# Patient Record
Sex: Female | Born: 1990 | Race: White | Hispanic: No | Marital: Single
Health system: Southern US, Community
[De-identification: ages and names within clinical notes are randomized; demographics above are authoritative.]

## PROBLEM LIST (undated history)

## (undated) DIAGNOSIS — B009 Herpesviral infection, unspecified: Secondary | ICD-10-CM

---

## 2021-02-09 ENCOUNTER — Ambulatory Visit (HOSPITAL_COMMUNITY)
Admission: EM | Admit: 2021-02-09 | Discharge: 2021-02-09 | Disposition: A | Discharge status: Home / Self Care | Payer: BC Managed Care – PPO | Attending: Family Medicine | Admitting: Family Medicine

## 2021-02-09 ENCOUNTER — Other Ambulatory Visit: Payer: Self-pay

## 2021-02-09 ENCOUNTER — Ambulatory Visit (INDEPENDENT_AMBULATORY_CARE_PROVIDER_SITE_OTHER): Payer: BC Managed Care – PPO

## 2021-02-09 ENCOUNTER — Encounter (HOSPITAL_COMMUNITY): Payer: Self-pay | Admitting: Emergency Medicine

## 2021-02-09 DIAGNOSIS — J9801 Acute bronchospasm: Secondary | ICD-10-CM

## 2021-02-09 DIAGNOSIS — R0602 Shortness of breath: Secondary | ICD-10-CM | POA: Diagnosis not present

## 2021-02-09 DIAGNOSIS — J069 Acute upper respiratory infection, unspecified: Secondary | ICD-10-CM | POA: Diagnosis not present

## 2021-02-09 DIAGNOSIS — R059 Cough, unspecified: Secondary | ICD-10-CM

## 2021-02-09 MED ORDER — HYDROCODONE BIT-HOMATROP MBR 5-1.5 MG/5ML PO SOLN: 5.0000 mL | Freq: Four times a day (QID) | ORAL | 0 refills | Status: DC | PRN

## 2021-02-09 MED ORDER — PREDNISONE 20 MG PO TABS: 40.0000 mg | ORAL_TABLET | Freq: Every day | ORAL | 0 refills | Status: DC

## 2021-02-09 NOTE — Discharge Instructions (Signed)
Be aware, your cough medication may cause drowsiness. Please do not drive, operate heavy machinery or make important decisions while on this medication, it can cloud your judgement.  

## 2021-02-09 NOTE — ED Triage Notes (Signed)
Had head cold for 3 weeks. Last week was seen at doctor and was negative for covid and flu and was given amoxicillin. Reports cough is getting worse and SOB with exertion    Neck and abd sore from coughing

## 2021-02-09 NOTE — ED Provider Notes (Signed)
[facility]   [id number] 02/09/21 Arrival Time: 0846  ASSESSMENT & PLAN:  1. Viral URI with cough   2. Bronchospasm, acute    I have personally viewed the imaging studies ordered this visit. No signs of PNA appreciated on CXR.  Likely post-viral cough with bronchospam. Discussed. OTC symptom care as needed.  Meds ordered this encounter  Medications   predniSONE (DELTASONE) 20 MG tablet    Sig: Take 2 tablets (40 mg total) by mouth daily.    Dispense:  10 tablet    Refill:  0   HYDROcodone bit-homatropine (HYCODAN) 5-1.5 MG/5ML syrup    Sig: Take 5 mLs by mouth every 6 (six) hours as needed for cough.    Dispense:  90 mL    Refill:  0     Follow-up Information     Weber, Dema Severin, PA-C.   Specialty: Physician Assistant Why: If worsening or failing to improve as anticipated. Contact information: [address] [place] [address] [phone]                 Reviewed expectations re: course of current medical issues. Questions answered. Outlined signs and symptoms indicating need for more acute intervention. Understanding verbalized. After Visit Summary given.   SUBJECTIVE: History from: patient. Sally Ortiz is a 30 y.o. female who reports: URI symptoms; past 2-3 weeks; finished amox Rx by PCP; neg flu and COVID. Now cough bothering her the most; dry and persistent; affecting sleep; feels she is wheezing at night. Denies: fever. Normal PO intake without n/v/d.  Social History   Tobacco Use  Smoking Status Not on file  Smokeless Tobacco Not on file     OBJECTIVE:  Vitals:   02/09/21 1040  BP: 115/62  Pulse: 60  Resp: 16  Temp: 98.8 F (37.1 C)  TempSrc: Oral  SpO2: 100%    General appearance: alert; no distress Eyes: PERRLA; EOMI; conjunctiva normal HENT: [place]; AT; with mild nasal congestion Neck: supple  Lungs: speaks full sentences without difficulty; unlabored; mild bilateral exp wheezes Extremities: no  edema Skin: warm and dry Neurologic: normal gait Psychological: alert and cooperative; normal mood and affect   Imaging: DG Chest 2 View  Result Date: 02/09/2021 CLINICAL DATA:  Cough and shortness of breath. Head cold for 3 weeks. EXAM: CHEST - 2 VIEW COMPARISON:  None. FINDINGS: Prominent bronchial markings particularly in the left lower lobe. Lungs otherwise clear. No pleural effusion or pneumothorax. Normal heart, mediastinum and hila. Skeletal structures are unremarkable. IMPRESSION: 1. Prominent bronchial markings particularly in the left lower lobe. Suspect active bronchitis given the patient's history. No evidence of pneumonia. Electronically Signed   By: Amie Portland M.D.   On: 02/09/2021 11:00    Not on File  History reviewed. No pertinent past medical history. Social History   Socioeconomic History   Marital status: Single    Spouse name: Not on file   Number of children: Not on file   Years of education: Not on file   Highest education level: Not on file  Occupational History   Not on file  Tobacco Use   Smoking status: Not on file   Smokeless tobacco: Not on file  Substance and Sexual Activity   Alcohol use: Not on file   Drug use: Not on file   Sexual activity: Not on file  Other Topics Concern   Not on file  Social History Narrative   Not on file   Social Determinants of  Health   Financial Resource Strain: Not on file  Food Insecurity: Not on file  Transportation Needs: Not on file  Physical Activity: Not on file  Stress: Not on file  Social Connections: Not on file  Intimate Partner Violence: Not on file   No family history on file. History reviewed. No pertinent surgical history.   Mardella Layman, MD 02/09/21 1124

## 2022-11-06 LAB — OB RESULTS CONSOLE RUBELLA ANTIBODY, IGM: Rubella: NON-IMMUNE/NOT IMMUNE

## 2022-11-06 LAB — OB RESULTS CONSOLE GC/CHLAMYDIA
Chlamydia: NEGATIVE
Neisseria Gonorrhea: NEGATIVE

## 2022-11-06 LAB — OB RESULTS CONSOLE HEPATITIS B SURFACE ANTIGEN: Hepatitis B Surface Ag: NEGATIVE

## 2022-11-06 LAB — HEPATITIS C ANTIBODY: HCV Ab: NEGATIVE

## 2022-11-06 LAB — OB RESULTS CONSOLE VARICELLA ZOSTER ANTIBODY, IGG: Varicella: NON-IMMUNE/NOT IMMUNE

## 2022-11-06 LAB — OB RESULTS CONSOLE HIV ANTIBODY (ROUTINE TESTING): HIV: NONREACTIVE

## 2022-12-12 IMAGING — DX DG CHEST 2V
2 series · 2 of 2 positions shown · non-contrast
Comparison: None.

CLINICAL DATA: Cough and shortness of breath. Head cold for 3
weeks.

EXAM:
CHEST - 2 VIEW

[chest pa]
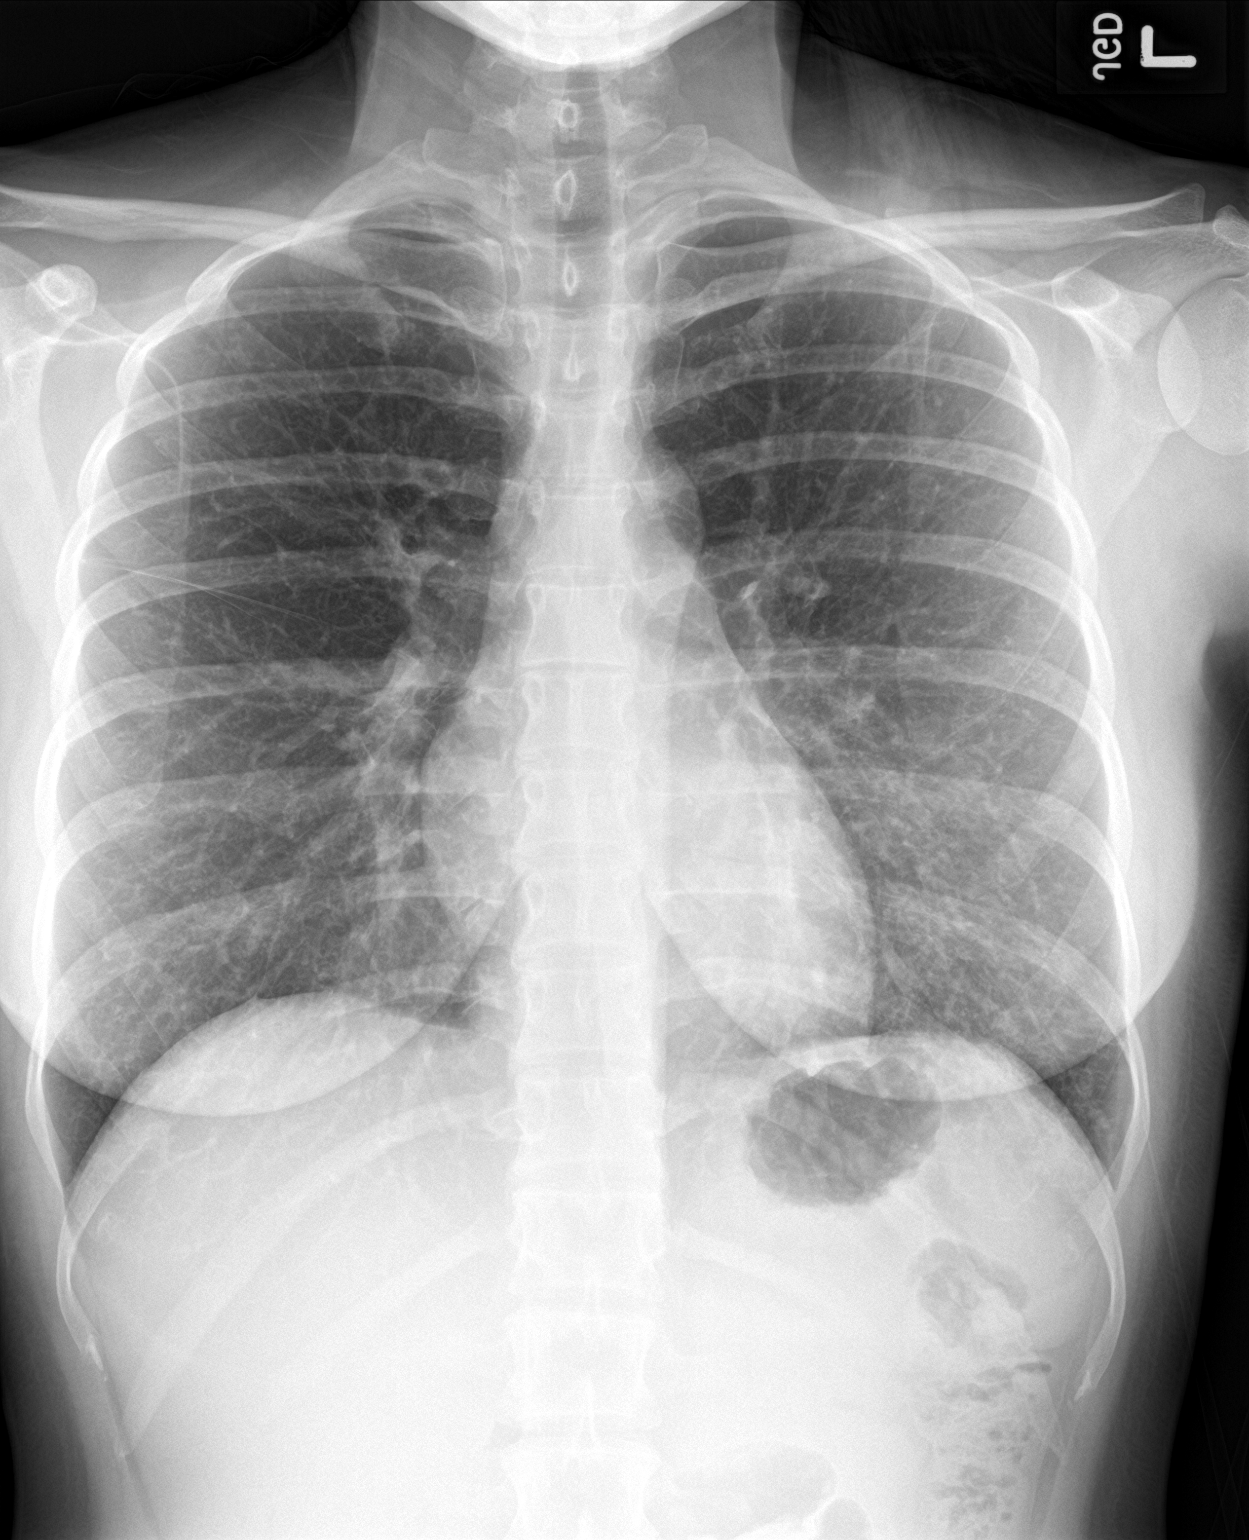

[chest lat]
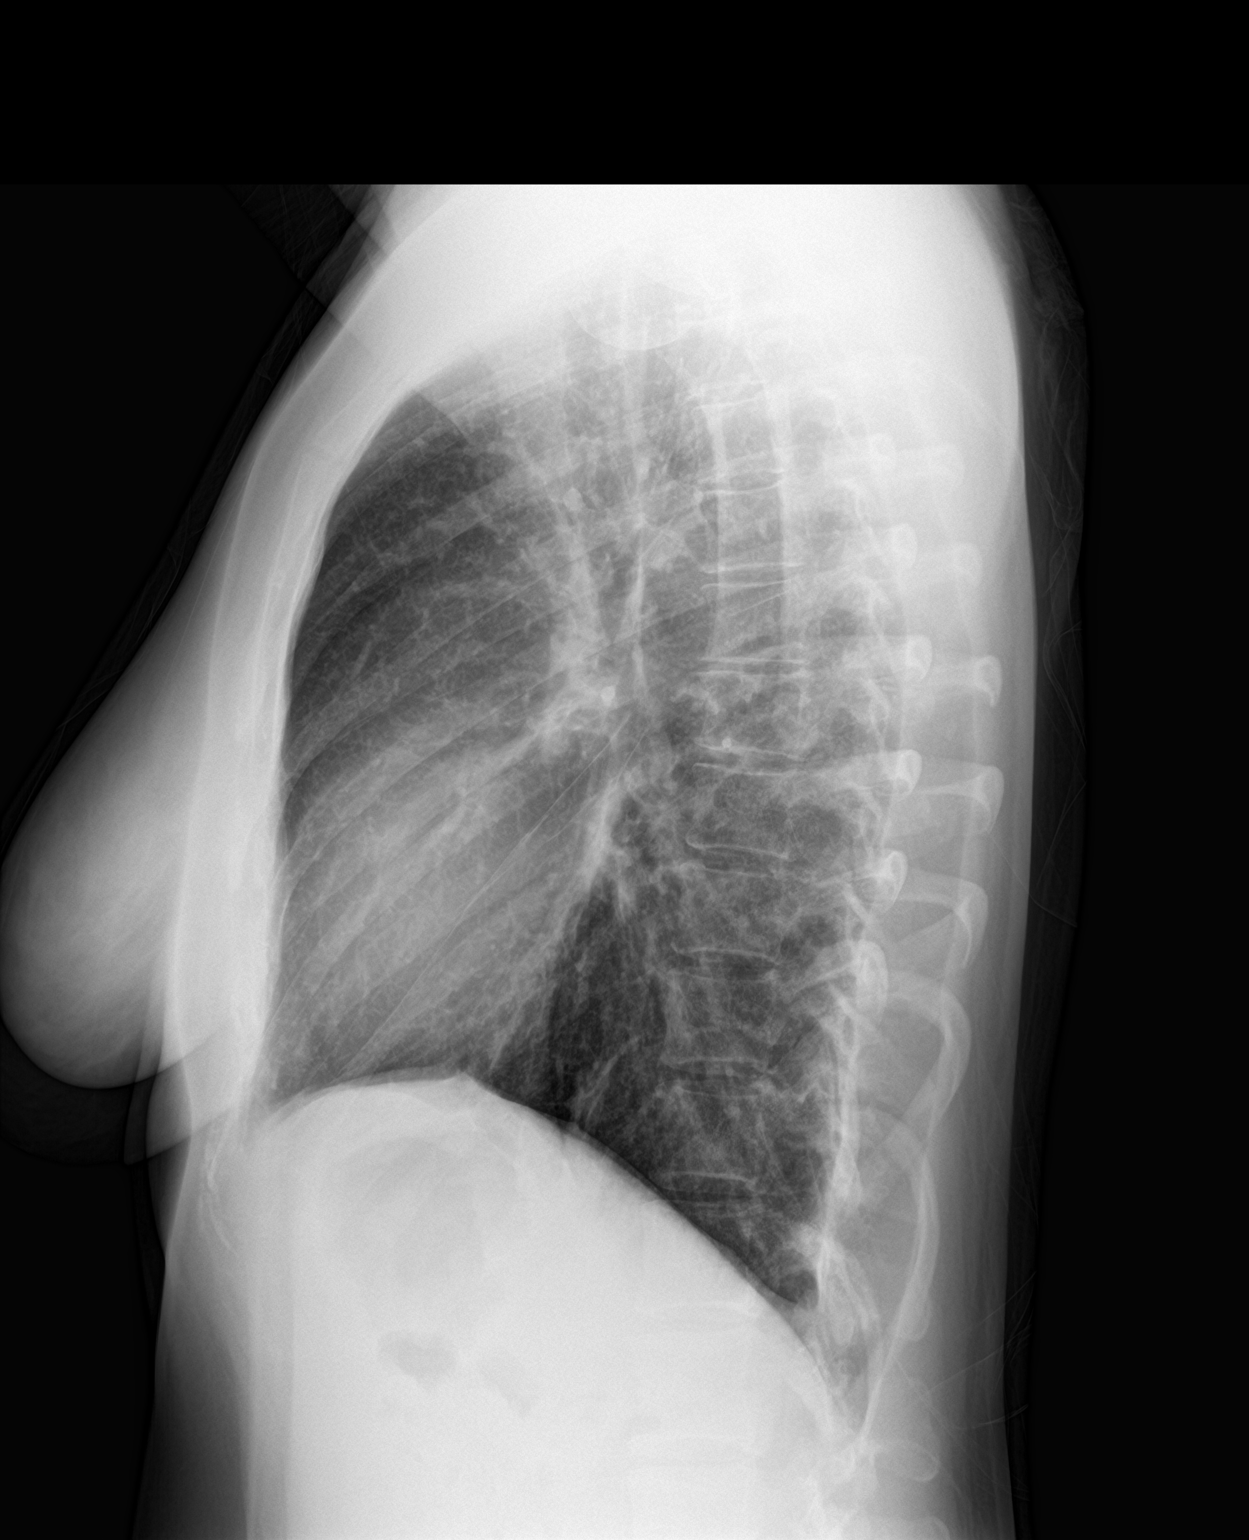

[2 of 2 positions shown; findings below may reference images not displayed]

FINDINGS: Prominent bronchial markings particularly in the left lower lobe.
Lungs otherwise clear.

No pleural effusion or pneumothorax.

Normal heart, mediastinum and hila.

Skeletal structures are unremarkable.
IMPRESSION: 1. Prominent bronchial markings particularly in the left lower lobe.
Suspect active bronchitis given the patient's history. No evidence
of pneumonia.

## 2023-04-11 NOTE — L&D Delivery Note (Signed)
   Delivery Note:   G1P0 at [redacted]w[redacted]d  Admitting diagnosis: Normal labor [O80, Z37.9] Risks: HSV, on Valtrex; gestational thrombocytopenia Onset of labor: 05/26/2023 at 1600 IOL/Augmentation: N/A ROM: 05/26/2023 at 1600, clear fluid  Complete dilation at 05/26/2023 2257 Onset of pushing at 2257 FHR second stage Cat I  Analgesia/Anesthesia intrapartum:Epidural Pushing in lithotomy position with CNM and L&D staff support. Husband, Judie Grieve, present for birth and supportive.  Delivery of a Live born female  Birth Weight:  pending APGAR: 9, 9  Newborn Delivery   Birth date/time: 05/26/2023 23:08:10 Delivery type: Vaginal, Spontaneous    in cephalic presentation, position OA to LOA.  APGAR:1 min-9 , 5 min-9   Nuchal Cord: No  Cord double clamped after cessation of pulsation, cut by Judie Grieve.  Collection of cord blood for typing completed. Arterial cord blood sample-No   Placenta delivered-Spontaneous with 3 vessels. Uterotonics: None Placenta to L&D Uterine tone firm  Bleeding scant  Vaginal laceration identified.  Episiotomy:None Local analgesia: N/A  Repair: 3-0 in usual fashion Est. Blood Loss (mL):110.00  Complications: None  Mom to postpartum. Baby Walker to Couplet care / Skin to Skin.  Delivery Report:  Review the Delivery Report for details.    June Leap, CNM, MSN 05/26/2023, 11:26 PM

## 2023-05-08 ENCOUNTER — Inpatient Hospital Stay (HOSPITAL_COMMUNITY)
Admission: AD | Admit: 2023-05-08 | Discharge: 2023-05-08 | Disposition: A | Discharge status: Home / Self Care | Payer: BC Managed Care – PPO | Attending: Obstetrics and Gynecology | Admitting: Obstetrics and Gynecology

## 2023-05-08 ENCOUNTER — Encounter (HOSPITAL_COMMUNITY): Payer: Self-pay | Admitting: Obstetrics and Gynecology

## 2023-05-08 ENCOUNTER — Encounter: Payer: Self-pay | Admitting: Obstetrics and Gynecology

## 2023-05-08 DIAGNOSIS — J029 Acute pharyngitis, unspecified: Secondary | ICD-10-CM

## 2023-05-08 DIAGNOSIS — O99513 Diseases of the respiratory system complicating pregnancy, third trimester: Secondary | ICD-10-CM | POA: Diagnosis present

## 2023-05-08 DIAGNOSIS — O26893 Other specified pregnancy related conditions, third trimester: Secondary | ICD-10-CM | POA: Diagnosis not present

## 2023-05-08 DIAGNOSIS — Z3A36 36 weeks gestation of pregnancy: Secondary | ICD-10-CM | POA: Diagnosis not present

## 2023-05-08 DIAGNOSIS — J101 Influenza due to other identified influenza virus with other respiratory manifestations: Secondary | ICD-10-CM | POA: Diagnosis not present

## 2023-05-08 DIAGNOSIS — R509 Fever, unspecified: Secondary | ICD-10-CM

## 2023-05-08 DIAGNOSIS — R0981 Nasal congestion: Secondary | ICD-10-CM

## 2023-05-08 HISTORY — DX: Herpesviral infection, unspecified: B00.9

## 2023-05-08 LAB — RESPIRATORY PANEL BY PCR

## 2023-05-08 MED ORDER — FLUTICASONE PROPIONATE 50 MCG/ACT NA SUSP
2.0000 | Freq: Once | NASAL | Status: AC
  Administered 2023-05-08: 2 via NASAL
  Filled 2023-05-08: qty 16

## 2023-05-08 MED ORDER — OSELTAMIVIR PHOSPHATE 75 MG PO CAPS: 75.0000 mg | ORAL_CAPSULE | Freq: Two times a day (BID) | ORAL | 0 refills | Status: DC

## 2023-05-08 MED ORDER — GUAIFENESIN ER 600 MG PO TB12
600.0000 mg | ORAL_TABLET | Freq: Once | ORAL | Status: AC
  Administered 2023-05-08: 600 mg via ORAL
  Filled 2023-05-08: qty 1

## 2023-05-08 MED ORDER — GUAIFENESIN ER 600 MG PO TB12: 600.0000 mg | ORAL_TABLET | Freq: Two times a day (BID) | ORAL | 0 refills | Status: DC | PRN

## 2023-05-08 MED ORDER — PHENOL 1.4 % MT LIQD
1.0000 | OROMUCOSAL | Status: DC | PRN
  Administered 2023-05-08: 1 via OROMUCOSAL
  Filled 2023-05-08: qty 177

## 2023-05-08 MED ORDER — ACETAMINOPHEN 500 MG PO TABS
1000.0000 mg | ORAL_TABLET | Freq: Once | ORAL | Status: AC
  Administered 2023-05-08: 1000 mg via ORAL
  Filled 2023-05-08: qty 2

## 2023-05-08 NOTE — MAU Note (Signed)
..  Sally Ortiz is a 33 y.o. at [redacted]w[redacted]d here in MAU reporting: flu-like symptoms for 2 days. Fever of 102 around 0400. Took tylenol at 2000 last night.  Has had body aches, chills, congestion, cough. Denies vaginal bleeding, leaking of fluid or contractions. +FM  Pain score: 7/10 Vitals:   05/08/23 0537  BP: 113/67  Pulse: (!) 118  Resp: 18  Temp: (!) 100.5 F (38.1 C)  SpO2: 98%     FHT:152 Lab orders placed from triage:  none

## 2023-05-08 NOTE — MAU Provider Note (Signed)
Chief Complaint:  No chief complaint on file.   Event Date/Time   First Provider Initiated Contact with Patient 05/08/23 [phone]     HPI: Sally Ortiz is a 33 y.o. G1P0 at 67w4dwho presents to maternity admissions reporting flu-like symptoms for 2 days with fever 102 last night.  Husband is just recovering from same.. She reports good fetal movement, denies LOF, vaginal bleeding, n/v, diarrhea, constipation or fever/chills.  No history of asthma.    Fever  This is a new problem. The current episode started in the past 7 days. The problem has been unchanged. The maximum temperature noted was 102 to 102.9 F. Associated symptoms include congestion, coughing, muscle aches and a sore throat. Pertinent negatives include no abdominal pain or ear pain. She has tried acetaminophen for the symptoms. The treatment provided mild relief.   RN Note: Norrine Algeo is a 33 y.o. at [redacted]w[redacted]d here in MAU reporting: flu-like symptoms for 2 days. Fever of 102 around 0400. Took tylenol at 2000 last night.  Has had body aches, chills, congestion, cough. Denies vaginal bleeding, leaking of fluid or contractions. +FM  Pain score: 7/10  Past Medical History: No past medical history on file.  Past obstetric history: OB History  Gravida Para Term Preterm AB Living  1       SAB IAB Ectopic Multiple Live Births          # Outcome Date GA Lbr Len/2nd Weight Sex Type Anes PTL Lv  1 Current             Past Surgical History: No past surgical history on file.  Family History: No family history on file.  Social History:    Allergies: Not on File  Meds:  Medications Prior to Admission  Medication Sig Dispense Refill Last Dose/Taking   HYDROcodone bit-homatropine (HYCODAN) 5-1.5 MG/5ML syrup Take 5 mLs by mouth every 6 (six) hours as needed for cough. 90 mL 0    predniSONE (DELTASONE) 20 MG tablet Take 2 tablets (40 mg total) by mouth daily. 10 tablet 0     I have reviewed patient's Past Medical Hx, Surgical  Hx, Family Hx, Social Hx, medications and allergies.   ROS:  Review of Systems  Constitutional:  Positive for fever.  HENT:  Positive for congestion and sore throat. Negative for ear pain.   Respiratory:  Positive for cough.   Gastrointestinal:  Negative for abdominal pain.   Other systems negative  Physical Exam  Patient Vitals for the past 24 hrs:  BP Temp Temp src Pulse Resp SpO2 Height Weight  05/08/23 0537 113/67 (!) 100.5 F (38.1 C) Oral (!) 118 18 98 % -- --  05/08/23 0532 -- -- -- -- -- -- 5\' 3"  (1.6 m) 87.8 kg   Constitutional: Well-developed, well-nourished female in no acute distress.  Cardiovascular: normal rate and rhythm Respiratory: normal effort, clear to auscultation bilaterally   No wheezes. GI: Abd soft, non-tender, gravid appropriate for gestational age.   No rebound or guarding. MS: Extremities nontender, no edema, normal ROM Neurologic: Alert and oriented x 4.  GU: Neg CVAT.   FHT:  Baseline 130 , moderate variability, accelerations present, no decelerations Contractions:  Irregular     Labs: Results for orders placed or performed during the hospital encounter of 05/08/23 (from the past 24 hours)  Respiratory (~20 pathogens) panel by PCR     Status: Abnormal   Collection Time: 05/08/23  5:50 AM   Specimen: Nasopharyngeal Swab; Respiratory  Result Value  Ref Range   Adenovirus NOT DETECTED NOT DETECTED   Coronavirus 229E NOT DETECTED NOT DETECTED   Coronavirus HKU1 NOT DETECTED NOT DETECTED   Coronavirus NL63 NOT DETECTED NOT DETECTED   Coronavirus OC43 NOT DETECTED NOT DETECTED   Metapneumovirus NOT DETECTED NOT DETECTED   Rhinovirus / Enterovirus NOT DETECTED NOT DETECTED   Influenza A H3 DETECTED (A) NOT DETECTED   Influenza B NOT DETECTED NOT DETECTED   Parainfluenza Virus 1 NOT DETECTED NOT DETECTED   Parainfluenza Virus 2 NOT DETECTED NOT DETECTED   Parainfluenza Virus 3 NOT DETECTED NOT DETECTED   Parainfluenza Virus 4 NOT DETECTED NOT  DETECTED   Respiratory Syncytial Virus NOT DETECTED NOT DETECTED   Bordetella pertussis NOT DETECTED NOT DETECTED   Bordetella Parapertussis NOT DETECTED NOT DETECTED   Chlamydophila pneumoniae NOT DETECTED NOT DETECTED   Mycoplasma pneumoniae NOT DETECTED NOT DETECTED       Imaging:  No results found.  MAU Course/MDM: I have reviewed the triage vital signs and the nursing notes.   Pertinent labs & imaging results that were available during my care of the patient were reviewed by me and considered in my medical decision making (see chart for details).      I have reviewed her medical records including past results, notes and treatments.   I have ordered labs and reviewed results. Informed her of Flu diagnosis and discussed Tamiflu. NST reviewed   Treatments in MAU included Tylenol for fever, Flonase for congestion and Chloraseptic for her sore throat. .    Assessment: Single IUP at [redacted]w[redacted]d Influenza A Sore throat Nasal congestion Fever  Plan: Discharge home Rx Tamiflu for Flu A Supportive care Flonase and Mucinex for congestion Labor precautions and fetal kick counts Follow up in Office for prenatal visits and recheck Encouraged to return if she develops worsening of symptoms, increase in pain, fever, or other concerning symptoms.   Pt stable at time of discharge.  Wynelle Bourgeois CNM, MSN Certified Nurse-Midwife 05/08/2023 5:48 AM

## 2023-05-11 LAB — OB RESULTS CONSOLE GBS: GBS: NEGATIVE

## 2023-05-25 ENCOUNTER — Inpatient Hospital Stay (HOSPITAL_COMMUNITY)
Admission: AD | Admit: 2023-05-25 | Payer: BC Managed Care – PPO | Source: Home / Self Care | Admitting: Obstetrics and Gynecology

## 2023-05-25 ENCOUNTER — Inpatient Hospital Stay (HOSPITAL_COMMUNITY): Payer: BC Managed Care – PPO

## 2023-05-26 ENCOUNTER — Encounter (HOSPITAL_COMMUNITY): Payer: Self-pay | Admitting: Obstetrics & Gynecology

## 2023-05-26 ENCOUNTER — Inpatient Hospital Stay (HOSPITAL_COMMUNITY): Payer: BC Managed Care – PPO | Admitting: Anesthesiology

## 2023-05-26 ENCOUNTER — Inpatient Hospital Stay (HOSPITAL_COMMUNITY)
Admission: AD | Admit: 2023-05-26 | Discharge: 2023-05-28 | DRG: 806 | Disposition: A | Discharge status: Home / Self Care | Payer: BC Managed Care – PPO | Attending: Obstetrics & Gynecology | Admitting: Obstetrics & Gynecology

## 2023-05-26 DIAGNOSIS — D696 Thrombocytopenia, unspecified: Secondary | ICD-10-CM | POA: Diagnosis present

## 2023-05-26 DIAGNOSIS — O26893 Other specified pregnancy related conditions, third trimester: Secondary | ICD-10-CM | POA: Diagnosis present

## 2023-05-26 DIAGNOSIS — O9832 Other infections with a predominantly sexual mode of transmission complicating childbirth: Secondary | ICD-10-CM | POA: Diagnosis present

## 2023-05-26 DIAGNOSIS — A6000 Herpesviral infection of urogenital system, unspecified: Secondary | ICD-10-CM | POA: Diagnosis present

## 2023-05-26 DIAGNOSIS — Z3A39 39 weeks gestation of pregnancy: Secondary | ICD-10-CM | POA: Diagnosis not present

## 2023-05-26 DIAGNOSIS — O9912 Other diseases of the blood and blood-forming organs and certain disorders involving the immune mechanism complicating childbirth: Secondary | ICD-10-CM | POA: Diagnosis present

## 2023-05-26 DIAGNOSIS — S3141XA Laceration without foreign body of vagina and vulva, initial encounter: Secondary | ICD-10-CM | POA: Diagnosis not present

## 2023-05-26 LAB — TYPE AND SCREEN
ABO/RH(D): O POS
Antibody Screen: NEGATIVE

## 2023-05-26 LAB — CBC
HCT: 35.7 % — ABNORMAL LOW (ref 36.0–46.0)
Hemoglobin: 12.3 g/dL (ref 12.0–15.0)
MCH: 33.8 pg (ref 26.0–34.0)
MCHC: 34.5 g/dL (ref 30.0–36.0)
MCV: 98.1 fL (ref 80.0–100.0)
Platelets: 118 10*3/uL — ABNORMAL LOW (ref 150–400)
RBC: 3.64 MIL/uL — ABNORMAL LOW (ref 3.87–5.11)
RDW: 13.2 % (ref 11.5–15.5)
WBC: 14.3 10*3/uL — ABNORMAL HIGH (ref 4.0–10.5)
nRBC: 0 % (ref 0.0–0.2)

## 2023-05-26 MED ORDER — PHENYLEPHRINE 80 MCG/ML (10ML) SYRINGE FOR IV PUSH (FOR BLOOD PRESSURE SUPPORT): 80.0000 ug | PREFILLED_SYRINGE | INTRAVENOUS | Status: DC | PRN

## 2023-05-26 MED ORDER — LACTATED RINGERS IV SOLN: INTRAVENOUS | Status: DC

## 2023-05-26 MED ORDER — FLEET ENEMA RE ENEM: 1.0000 | ENEMA | RECTAL | Status: DC | PRN

## 2023-05-26 MED ORDER — ONDANSETRON HCL 4 MG/2ML IJ SOLN: 4.0000 mg | Freq: Four times a day (QID) | INTRAMUSCULAR | Status: DC | PRN

## 2023-05-26 MED ORDER — SOD CITRATE-CITRIC ACID 500-334 MG/5ML PO SOLN: 30.0000 mL | ORAL | Status: DC | PRN

## 2023-05-26 MED ORDER — DIPHENHYDRAMINE HCL 50 MG/ML IJ SOLN: 12.5000 mg | INTRAMUSCULAR | Status: DC | PRN

## 2023-05-26 MED ORDER — OXYCODONE-ACETAMINOPHEN 5-325 MG PO TABS: 2.0000 | ORAL_TABLET | ORAL | Status: DC | PRN

## 2023-05-26 MED ORDER — LACTATED RINGERS IV SOLN: 500.0000 mL | Freq: Once | INTRAVENOUS | Status: DC

## 2023-05-26 MED ORDER — EPHEDRINE 5 MG/ML INJ
10.0000 mg | INTRAVENOUS | Status: DC | PRN
Start: 2023-05-26 — End: 2023-05-26

## 2023-05-26 MED ORDER — LIDOCAINE HCL (PF) 1 % IJ SOLN: 30.0000 mL | INTRAMUSCULAR | Status: DC | PRN

## 2023-05-26 MED ORDER — OXYTOCIN-SODIUM CHLORIDE 30-0.9 UT/500ML-% IV SOLN
2.5000 [IU]/h | INTRAVENOUS | Status: DC
  Filled 2023-05-26: qty 500

## 2023-05-26 MED ORDER — PHENYLEPHRINE 80 MCG/ML (10ML) SYRINGE FOR IV PUSH (FOR BLOOD PRESSURE SUPPORT)
80.0000 ug | PREFILLED_SYRINGE | INTRAVENOUS | Status: DC | PRN
Start: 2023-05-26 — End: 2023-05-26

## 2023-05-26 MED ORDER — OXYTOCIN BOLUS FROM INFUSION: 333.0000 mL | Freq: Once | INTRAVENOUS | Status: DC

## 2023-05-26 MED ORDER — PHENYLEPHRINE 80 MCG/ML (10ML) SYRINGE FOR IV PUSH (FOR BLOOD PRESSURE SUPPORT)
80.0000 ug | PREFILLED_SYRINGE | INTRAVENOUS | Status: DC | PRN
  Filled 2023-05-26: qty 10

## 2023-05-26 MED ORDER — ACETAMINOPHEN 325 MG PO TABS: 650.0000 mg | ORAL_TABLET | ORAL | Status: DC | PRN

## 2023-05-26 MED ORDER — EPHEDRINE 5 MG/ML INJ: 10.0000 mg | INTRAVENOUS | Status: DC | PRN

## 2023-05-26 MED ORDER — LACTATED RINGERS IV SOLN: 500.0000 mL | INTRAVENOUS | Status: DC | PRN

## 2023-05-26 MED ORDER — FENTANYL-BUPIVACAINE-NACL 0.5-0.125-0.9 MG/250ML-% EP SOLN
12.0000 mL/h | EPIDURAL | Status: DC | PRN
  Administered 2023-05-26: 12 mL/h via EPIDURAL
  Filled 2023-05-26: qty 250

## 2023-05-26 MED ORDER — FENTANYL-BUPIVACAINE-NACL 0.5-0.125-0.9 MG/250ML-% EP SOLN: 12.0000 mL/h | EPIDURAL | Status: DC | PRN

## 2023-05-26 MED ORDER — EPHEDRINE 5 MG/ML INJ
10.0000 mg | INTRAVENOUS | Status: DC | PRN
  Filled 2023-05-26: qty 5

## 2023-05-26 MED ORDER — OXYCODONE-ACETAMINOPHEN 5-325 MG PO TABS: 1.0000 | ORAL_TABLET | ORAL | Status: DC | PRN

## 2023-05-26 MED ORDER — LIDOCAINE-EPINEPHRINE (PF) 2 %-1:200000 IJ SOLN
INTRAMUSCULAR | Status: DC | PRN
  Administered 2023-05-26: 3 mL via EPIDURAL

## 2023-05-26 NOTE — H&P (Signed)
 OB ADMISSION/ HISTORY & PHYSICAL:  Admission Date: 05/26/2023  5:41 PM  Admit Diagnosis: Normal labor [O80, Z37.9]    Sally Ortiz is a 33 y.o. female G1P0 at [redacted]w[redacted]d presenting for labor. States contractions all day and a gush of clear fluid at 1600. Now reports contractions every 3 minutes. Denies vaginal bleeding. Endorses + fetal movement. Husband, Judie Grieve, present and supportive. Eagerly anticipating a baby boy "Walker".   Prenatal History: G1P0   EDC: 06/01/2023 Prenatal care at [facility] since 10 weeks  Primary: A. Yetta Barre, CNM  Prenatal course complicated by: History of HSV, on Valtrex 1g daily since [redacted] weeks Gestational thrombocytopenia, resolved at 38 weeks, platelets in the office 186 Fetal renal pyelectasis, resolved at 37 week U/S  Prenatal Labs: ABO, Rh:   O POS Antibody: PENDING (02/15 1755) Rubella:   Immune RPR:   Non-reactive HBsAg:   Negative HIV:   Negative GBS:   Negative 1 hr Glucola : 123 Genetic Screening: Low risk Panorama XY Ultrasound: normal XY anatomy, posterior placenta, EFW 71% at 37 weeks    Maternal Diabetes: No Genetic Screening: Normal Maternal Ultrasounds/Referrals: Normal Fetal Ultrasounds or other Referrals:  None Maternal Substance Abuse:  No Significant Maternal Medications:  None Significant Maternal Lab Results:  Group B Strep negative Other Comments:  None  Medical / Surgical History : Past medical history:  Past Medical History:  Diagnosis Date   HSV (herpes simplex virus) infection     Past surgical history: No past surgical history on file.  Family History: No family history on file.  Social History:  reports that she has never smoked. She has never used smokeless tobacco. She reports that she does not drink alcohol and does not use drugs.  Allergies: Erythromycin   Current Medications at time of admission:  Medications Prior to Admission  Medication Sig Dispense Refill Last Dose/Taking   ferrous sulfate 325 (65  FE) MG EC tablet Take 325 mg by mouth 3 (three) times daily with meals.   05/25/2023 Evening   Prenatal Vit-Fe Fumarate-FA (MULTIVITAMIN-PRENATAL) 27-0.8 MG TABS tablet Take 1 tablet by mouth daily at 12 noon.   05/25/2023 Evening   valACYclovir (VALTREX) 500 MG tablet Take 500 mg by mouth 2 (two) times daily.   05/25/2023 Evening   guaiFENesin (MUCINEX) 600 MG 12 hr tablet Take 1 tablet (600 mg total) by mouth 2 (two) times daily as needed for to loosen phlegm. 60 tablet 0    HYDROcodone bit-homatropine (HYCODAN) 5-1.5 MG/5ML syrup Take 5 mLs by mouth every 6 (six) hours as needed for cough. 90 mL 0    oseltamivir (TAMIFLU) 75 MG capsule Take 1 capsule (75 mg total) by mouth every 12 (twelve) hours. 10 capsule 0    predniSONE (DELTASONE) 20 MG tablet Take 2 tablets (40 mg total) by mouth daily. 10 tablet 0     Review of Systems: Review of Systems  All other systems reviewed and are negative.  Physical Exam: Vital signs and nursing notes reviewed.  Patient Vitals for the past 24 hrs:  BP Temp Temp src Pulse Resp SpO2  05/26/23 1752 120/78 98.7 F (37.1 C) Oral 85 16 --  05/26/23 1750 (!) 83/68 -- -- 79 -- 96 %    General: AAO x 3, NAD Heart: RRR Lungs:CTAB Abdomen: Gravid, NT Extremities: no edema SVE: Dilation: 3 Effacement (%): 90 Station: -1 Presentation: Vertex Exam by:: Boone Master, RN   FHR: 135BPM, moderate variability, + accels, no decels TOCO: Contractions every 2-3  minutes  Labs:   Recent Labs    05/26/23 1755  WBC 14.3*  HGB 12.3  HCT 35.7*  PLT 118*   Assessment/Plan: 33 y.o. G1P0 at [redacted]w[redacted]d, early labor with SROM History of HSV, on Valtrex, no lesions on exam Gestational thrombocytopenia, last platelets in the office 186, on admission, 118  Fetal wellbeing - FHT category 1 EFW AGA 7-8lbs  Labor: Plan expectant management, Pitocin prn  GBS negative Rubella immune Rh positive  Pain control: originally desired unmedicated waterbirth, now desires  epidural Analgesia/anesthesia PRN  Anticipated MOD: NSVB  Plans to breastfeed, plans circumcision. POC discussed with patient and support team, all questions answered.  Dr. Juliene Pina notified of admission/plan of care.  June Leap CNM, MSN 05/26/2023, 6:49 PM

## 2023-05-26 NOTE — Anesthesia Preprocedure Evaluation (Signed)
 Anesthesia Evaluation  Patient identified by MRN, date of birth, ID band Patient awake    Airway Mallampati: I       Dental no notable dental hx.    Pulmonary    Pulmonary exam normal        Cardiovascular Normal cardiovascular exam     Neuro/Psych    GI/Hepatic   Endo/Other    Renal/GU      Musculoskeletal   Abdominal   Peds  Hematology   Anesthesia Other Findings   Reproductive/Obstetrics (+) Pregnancy                             Anesthesia Physical Anesthesia Plan  ASA: 2  Anesthesia Plan: Epidural   Post-op Pain Management:    Induction:   PONV Risk Score and Plan: 0  Airway Management Planned: Natural Airway  Additional Equipment: None  Intra-op Plan:   Post-operative Plan:   Informed Consent: I have reviewed the patients History and Physical, chart, labs and discussed the procedure including the risks, benefits and alternatives for the proposed anesthesia with the patient or authorized representative who has indicated his/her understanding and acceptance.       Plan Discussed with:   Anesthesia Plan Comments: (Lab Results      Component                Value               Date                      WBC                      14.3 (H)            05/26/2023                HGB                      12.3                05/26/2023                HCT                      35.7 (L)            05/26/2023                MCV                      98.1                05/26/2023                PLT                      118 (L)             05/26/2023           )       Anesthesia Quick Evaluation

## 2023-05-26 NOTE — Anesthesia Procedure Notes (Signed)
 Epidural Patient location during procedure: OB Start time: 05/26/2023 6:58 PM End time: 05/26/2023 7:07 PM  Staffing Anesthesiologist: Shelton Silvas, MD Performed: anesthesiologist   Preanesthetic Checklist Completed: patient identified, IV checked, site marked, risks and benefits discussed, surgical consent, monitors and equipment checked, pre-op evaluation and timeout performed  Epidural Patient position: sitting Prep: DuraPrep Patient monitoring: heart rate, continuous pulse ox and blood pressure Approach: midline Location: L3-L4 Injection technique: LOR saline  Needle:  Needle type: Tuohy  Needle gauge: 17 G Needle length: 9 cm Catheter type: closed end flexible Catheter size: 20 Guage Test dose: negative and 1.5% lidocaine  Assessment Events: blood not aspirated, no cerebrospinal fluid, injection not painful, no injection resistance and no paresthesia  Additional Notes LOR @ 5.5 on 1st attempt, unable to advance catheter  LOR @ 5.5 on 2nd attempt, catheter advanced with some resistance.   Patient identified. Risks/Benefits/Options discussed with patient including but not limited to bleeding, infection, nerve damage, paralysis, failed block, incomplete pain control, headache, blood pressure changes, nausea, vomiting, reactions to medications, itching and postpartum back pain. Confirmed with bedside nurse the patient's most recent platelet count. Confirmed with patient that they are not currently taking any anticoagulation, have any bleeding history or any family history of bleeding disorders. Patient expressed understanding and wished to proceed. All questions were answered. Sterile technique was used throughout the entire procedure. Please see nursing notes for vital signs. Test dose was given through epidural catheter and negative prior to continuing to dose epidural or start infusion. Warning signs of high block given to the patient including shortness of breath,  tingling/numbness in hands, complete motor block, or any concerning symptoms with instructions to call for help. Patient was given instructions on fall risk and not to get out of bed. All questions and concerns addressed with instructions to call with any issues or inadequate analgesia.    Reason for block:procedure for pain

## 2023-05-27 ENCOUNTER — Encounter (HOSPITAL_COMMUNITY): Payer: Self-pay | Admitting: Obstetrics & Gynecology

## 2023-05-27 LAB — CBC WITH DIFFERENTIAL/PLATELET
Abs Immature Granulocytes: 0.1 10*3/uL — ABNORMAL HIGH (ref 0.00–0.07)
Basophils Absolute: 0 10*3/uL (ref 0.0–0.1)
Basophils Relative: 0 %
Eosinophils Absolute: 0 10*3/uL (ref 0.0–0.5)
Eosinophils Relative: 0 %
HCT: 30.9 % — ABNORMAL LOW (ref 36.0–46.0)
Hemoglobin: 10.7 g/dL — ABNORMAL LOW (ref 12.0–15.0)
Immature Granulocytes: 1 %
Lymphocytes Relative: 5 %
Lymphs Abs: 0.8 10*3/uL (ref 0.7–4.0)
MCH: 33.9 pg (ref 26.0–34.0)
MCHC: 34.6 g/dL (ref 30.0–36.0)
MCV: 97.8 fL (ref 80.0–100.0)
Monocytes Absolute: 1.1 10*3/uL — ABNORMAL HIGH (ref 0.1–1.0)
Monocytes Relative: 6 %
Neutro Abs: 15 10*3/uL — ABNORMAL HIGH (ref 1.7–7.7)
Neutrophils Relative %: 88 %
Platelets: 122 10*3/uL — ABNORMAL LOW (ref 150–400)
RBC: 3.16 MIL/uL — ABNORMAL LOW (ref 3.87–5.11)
RDW: 13.1 % (ref 11.5–15.5)
WBC: 17 10*3/uL — ABNORMAL HIGH (ref 4.0–10.5)
nRBC: 0 % (ref 0.0–0.2)

## 2023-05-27 LAB — CBC
HCT: 31.3 % — ABNORMAL LOW (ref 36.0–46.0)
Hemoglobin: 11.1 g/dL — ABNORMAL LOW (ref 12.0–15.0)
MCH: 34.5 pg — ABNORMAL HIGH (ref 26.0–34.0)
MCHC: 35.5 g/dL (ref 30.0–36.0)
MCV: 97.2 fL (ref 80.0–100.0)
Platelets: 135 10*3/uL — ABNORMAL LOW (ref 150–400)
RBC: 3.22 MIL/uL — ABNORMAL LOW (ref 3.87–5.11)
RDW: 13.1 % (ref 11.5–15.5)
WBC: 17.8 10*3/uL — ABNORMAL HIGH (ref 4.0–10.5)
nRBC: 0 % (ref 0.0–0.2)

## 2023-05-27 LAB — RPR: RPR Ser Ql: NONREACTIVE

## 2023-05-27 MED ORDER — ACETAMINOPHEN 325 MG PO TABS: 650.0000 mg | ORAL_TABLET | ORAL | Status: DC | PRN

## 2023-05-27 MED ORDER — SIMETHICONE 80 MG PO CHEW: 80.0000 mg | CHEWABLE_TABLET | ORAL | Status: DC | PRN

## 2023-05-27 MED ORDER — BENZOCAINE-MENTHOL 20-0.5 % EX AERO
1.0000 | INHALATION_SPRAY | CUTANEOUS | Status: DC | PRN
  Administered 2023-05-27: 1 via TOPICAL
  Filled 2023-05-27: qty 56

## 2023-05-27 MED ORDER — WITCH HAZEL-GLYCERIN EX PADS: 1.0000 | MEDICATED_PAD | CUTANEOUS | Status: DC | PRN

## 2023-05-27 MED ORDER — SENNOSIDES-DOCUSATE SODIUM 8.6-50 MG PO TABS
2.0000 | ORAL_TABLET | Freq: Every day | ORAL | Status: DC
  Administered 2023-05-27: 2 via ORAL
  Filled 2023-05-27: qty 2

## 2023-05-27 MED ORDER — COCONUT OIL OIL: 1.0000 | TOPICAL_OIL | Status: DC | PRN

## 2023-05-27 MED ORDER — DIPHENHYDRAMINE HCL 25 MG PO CAPS: 25.0000 mg | ORAL_CAPSULE | Freq: Four times a day (QID) | ORAL | Status: DC | PRN

## 2023-05-27 MED ORDER — TETANUS-DIPHTH-ACELL PERTUSSIS 5-2.5-18.5 LF-MCG/0.5 IM SUSY: 0.5000 mL | PREFILLED_SYRINGE | Freq: Once | INTRAMUSCULAR | Status: DC

## 2023-05-27 MED ORDER — ONDANSETRON HCL 4 MG/2ML IJ SOLN: 4.0000 mg | INTRAMUSCULAR | Status: DC | PRN

## 2023-05-27 MED ORDER — DIBUCAINE (PERIANAL) 1 % EX OINT: 1.0000 | TOPICAL_OINTMENT | CUTANEOUS | Status: DC | PRN

## 2023-05-27 MED ORDER — IBUPROFEN 600 MG PO TABS
600.0000 mg | ORAL_TABLET | Freq: Four times a day (QID) | ORAL | Status: DC
  Administered 2023-05-27 – 2023-05-28 (×4): 600 mg via ORAL
  Filled 2023-05-27 (×6): qty 1

## 2023-05-27 MED ORDER — PRENATAL MULTIVITAMIN CH
1.0000 | ORAL_TABLET | Freq: Every day | ORAL | Status: DC
  Administered 2023-05-27: 1 via ORAL
  Filled 2023-05-27: qty 1

## 2023-05-27 MED ORDER — ZOLPIDEM TARTRATE 5 MG PO TABS: 5.0000 mg | ORAL_TABLET | Freq: Every evening | ORAL | Status: DC | PRN

## 2023-05-27 MED ORDER — ONDANSETRON HCL 4 MG PO TABS: 4.0000 mg | ORAL_TABLET | ORAL | Status: DC | PRN

## 2023-05-27 NOTE — Lactation Note (Signed)
 This note was copied from a baby's chart. Lactation Consultation Note  Patient Name: Sally Ortiz Today's Date: 05/27/2023 Age:32 hours Reason for consult: L&D Initial assessment;1st time breastfeeding;Term MOB had infant latched on her left breast when LC entered room, infant had been feeding for 15 minutes, MOB switched infant to her right breast using football hold with pillow support infant BF additional 10 minutes. Infant total feeding was 25 minutes. MOB was doing skin to skin with infant when LC left the room. MOB attended BF classes during her pregnancy. MOB knows to BF infant by cues, on demand, every 2-3 hours, skin to skin. MOB knows to ask for further latch assistance on MBU if needed.   Maternal Data    Feeding Mother's Current Feeding Choice: Breast Milk  LATCH Score Latch: Grasps breast easily, tongue down, lips flanged, rhythmical sucking.  Audible Swallowing: A few with stimulation  Type of Nipple: Everted at rest and after stimulation  Comfort (Breast/Nipple): Soft / non-tender  Hold (Positioning): Assistance needed to correctly position infant at breast and maintain latch.  LATCH Score: 8   Lactation Tools Discussed/Used    Interventions Interventions: Assisted with latch;Skin to skin;Breast compression;Adjust position;Support pillows;Position options;Education  Discharge    Consult Status Consult Status: Follow-up from L&D    Frederico Hamman 05/27/2023, 12:18 AM

## 2023-05-27 NOTE — Lactation Note (Signed)
 This note was copied from a baby's chart. Lactation Consultation Note  Patient Name: Sally Ortiz [id number] Date: 05/27/2023 Age:31 hours Reason for consult: Follow-up assessment;1st time breastfeeding;Primapara;Term;Breastfeeding assistance Baby latched for 7 mins with swallows and released.  Breast feeding basics reviewed  see below   Maternal Data Has patient been taught Hand Expression?: Yes  Feeding Mother's Current Feeding Choice: Breast Milk  LATCH Score Latch: Grasps breast easily, tongue down, lips flanged, rhythmical sucking.  Audible Swallowing: Spontaneous and intermittent  Type of Nipple: Everted at rest and after stimulation  Comfort (Breast/Nipple): Soft / non-tender  Hold (Positioning): Assistance needed to correctly position infant at breast and maintain latch.  LATCH Score: 9   Lactation Tools Discussed/Used Tools: Pump;Flanges Flange Size: 18;21 Breast pump type: Manual Pump Education: Setup, frequency, and cleaning;Milk Storage Reason for Pumping: PRN - preparing for D/C  Interventions Interventions: Breast feeding basics reviewed;Assisted with latch;Skin to skin;Breast massage;Breast compression;Adjust position;Support pillows;Position options;Education;LC Services brochure;CDC Guidelines for Breast Pump Cleaning  Discharge Pump: Manual;Personal;DEBP  Consult Status Consult Status: Follow-up Date: 05/28/23 Follow-up type: In-patient    Sally Ortiz 05/27/2023, 12:40 PM

## 2023-05-27 NOTE — Lactation Note (Signed)
 This note was copied from a baby's chart. Lactation Consultation Note  Patient Name: Sally Ortiz Today's Date: 05/27/2023 Age:32 hours. P1  Reason for consult: Initial assessment;Primapara;1st time breastfeeding;Term Per mom baby recently fed 20 mins with swallows at 0805.  LC reviewed and updated the doc flow sheets with mom. HNV, 3 stools. BF x 2 20 -25 mins and attempts. Per mom the baby has been spitty.  Dad holding baby and he is asleep.  LC reviewed 24 hour feeding goals of feed with cues and by 3 hours STS and offer the breast. If baby is still hungry after the 1st breast offer the 2nd breast. ( 8-12 times a day )  LC encouraged mom to call with cues.   Maternal Data - no risk for milk supply     Feeding Mother's Current Feeding Choice: Breast Milk  LATCH Score 8-5    Lactation Tools Discussed/Used Tools: Pump;Flanges Flange Size: 18;21 Breast pump type: Manual Pump Education: Setup, frequency, and cleaning;Milk Storage Reason for Pumping: PRN - preparing for D/C  Interventions Interventions: Breast feeding basics reviewed;Reverse pressure;Hand pump;Education;CDC Guidelines for Breast Pump Cleaning  Discharge Pump: Manual;Personal;DEBP  Consult Status Consult Status: Follow-up Date: 05/27/23 Follow-up type: In-patient    Sally Ortiz 05/27/2023, 9:22 AM

## 2023-05-27 NOTE — Anesthesia Postprocedure Evaluation (Signed)
 Anesthesia Post Note  Patient: Sally Ortiz  Procedure(s) Performed: AN AD HOC LABOR EPIDURAL     Patient location during evaluation: Mother Baby Anesthesia Type: Epidural Level of consciousness: awake and alert Pain management: pain level controlled Vital Signs Assessment: post-procedure vital signs reviewed and stable Respiratory status: spontaneous breathing, nonlabored ventilation and respiratory function stable Cardiovascular status: stable Postop Assessment: no headache, no backache, epidural receding, no apparent nausea or vomiting, patient able to bend at knees, able to ambulate and adequate PO intake Anesthetic complications: no   No notable events documented.  Last Vitals:  Vitals:   05/27/23 1209 05/27/23 1417  BP: (!) 101/55 94/79  Pulse: 70 68  Resp: 18 18  Temp: 36.9 C 36.6 C  SpO2: 99% 100%    Last Pain:  Vitals:   05/27/23 1421  TempSrc:   PainSc: 0-No pain   Pain Goal:                   [employer]

## 2023-05-27 NOTE — Progress Notes (Signed)
 Post Partum Day 1   Subjective: no complaints, up ad lib, voiding, tolerating PO, and + flatus  Objective: Blood pressure (!) 101/55, pulse 70, temperature 98.4 F (36.9 C), temperature source Oral, resp. rate 18, height 5\' 3"  (1.6 m), weight 87.8 kg, SpO2 99%, unknown if currently breastfeeding.  Physical Exam:  General: cooperative and appears older than stated age Lochia: appropriate Uterine Fundus: firm Incision: healing well, no significant drainage DVT Evaluation: No evidence of DVT seen on physical exam. Negative Homan's sign.     Latest Ref Rng & Units 05/27/2023    5:12 AM 05/27/2023    1:12 AM 05/26/2023    5:55 PM  CBC  WBC 4.0 - 10.5 K/uL 17.8  17.0  14.3   Hemoglobin 12.0 - 15.0 g/dL 14.7  82.9  56.2   Hematocrit 36.0 - 46.0 % 31.3  30.9  35.7   Platelets 150 - 400 K/uL 135  122  118       Assessment/Plan: PPD #1 SVD, Boy, breast feeding LC to assist Routine PP care Circumcision tomorrow     LOS: 1 day   Robley Fries, MD 05/27/2023, 12:55 PM

## 2023-05-27 NOTE — Progress Notes (Signed)
 CSW attempted to meet with MOB due to history of depression/anxiety. When CSW entered room, several visitors were present. CSW to follow up at a later time.   Signed,  Norberto Sorenson, MSW, LCSWA, LCASA May 07, 2023 3:21 PM

## 2023-05-28 MED ORDER — IBUPROFEN 200 MG PO TABS
600.0000 mg | ORAL_TABLET | Freq: Four times a day (QID) | ORAL | Status: AC
Start: 2023-05-28 — End: 2023-06-04

## 2023-05-28 MED ORDER — POLYETHYLENE GLYCOL 3350 17 G PO PACK: 17.0000 g | PACK | Freq: Every day | ORAL | Status: AC

## 2023-05-28 MED ORDER — ACETAMINOPHEN 500 MG PO TABS: 1000.0000 mg | ORAL_TABLET | Freq: Four times a day (QID) | ORAL | Status: AC

## 2023-05-28 NOTE — Lactation Note (Signed)
 This note was copied from a baby's chart. Lactation Consultation Note  Patient Name: Sally Ortiz [place] [id number] Date: 05/28/2023 Age:32 hours Reason for consult: Follow-up assessment;Primapara;1st time breastfeeding;Term;Infant weight loss (5 % weight loss,) Per mom Breast feeding is going well since yesterday and last fed at 7:45 am. Per mom baby is for a circ this am.  LC reviewed breast feeding D/C teaching and the [facility] resources.  Per mom has LC resources from her friends.   Maternal Data Has patient been taught Hand Expression?: Yes  Feeding Mother's Current Feeding Choice: Breast Milk  LATCH Score 8-9     Lactation Tools Discussed/Used Tools: Pump Flange Size: 21;18 Breast pump type: Manual Pump Education: Milk Storage;Setup, frequency, and cleaning Reason for Pumping: PRN  Interventions Interventions: Breast feeding basics reviewed;Hand pump;Education;LC Services brochure;CDC Guidelines for Breast Pump Cleaning  Discharge Discharge Education: Engorgement and breast care Pump: Manual;Personal;DEBP WIC Program: No  Consult Status Consult Status: Complete Date: 05/28/23    Kathrin Greathouse 05/28/2023, 9:16 AM

## 2023-05-28 NOTE — Discharge Summary (Signed)
 Postpartum Discharge Summary    Patient Name: Sally Ortiz DOB: 01/08/1991 MRN: [id number]  Date of admission: 05/26/2023 Delivery date:05/26/2023 Delivering provider: Dorisann Frames K Date of discharge: 05/28/2023  Admitting diagnosis: Normal labor [O80, Z37.9] Intrauterine pregnancy: [redacted]w[redacted]d     Secondary diagnosis:  Principal Problem:   Postpartum care following vaginal delivery 2/15 Active Problems:   Gestational thrombocytopenia (HCC)   SVD (spontaneous vaginal delivery)   Vaginal laceration  Additional problems: None    Discharge diagnosis: Term Pregnancy Delivered                                              Post partum procedures: None Augmentation: N/A Complications: None  Hospital course: Onset of Labor With Vaginal Delivery      33 y.o. yo G1P1001 at [redacted]w[redacted]d was admitted in Latent Labor on 05/26/2023. Labor course was uncomplicated.  Membrane Rupture Time/Date: 5:30 PM,05/26/2023  Delivery Method:Vaginal, Spontaneous Operative Delivery:N/A Episiotomy: None Lacerations:  Vaginal Patient had a uncomplicated postpartum course.  She is ambulating, tolerating a regular diet, passing flatus, and urinating well. Patient is discharged home in stable condition on 05/28/23.  Newborn Data: Birth date:05/26/2023 Birth time:11:08 PM Gender:Female Living status:Living Apgars:9 ,9  Weight:3900 g  Magnesium Sulfate received: No BMZ received: No Rhophylac:N/A MMR:N/A T-DaP:Given prenatally Flu: No RSV Vaccine received: Yes Transfusion:No Immunizations administered: Immunization History  Administered Date(s) Administered   [employer] Covid-19 Vaccine Bivalent Booster 81yrs & up 04/22/2021    Physical exam  Vitals:   05/27/23 1209 05/27/23 1417 05/27/23 2036 05/28/23 0541  BP: (!) 101/55 94/79 102/62 90/64  Pulse: 70 68 71 64  Resp: 18 18 18 18   Temp: 98.4 F (36.9 C) 97.9 F (36.6 C) 98.4 F (36.9 C) 98 F (36.7 C)  TempSrc: Oral Oral Oral Oral  SpO2: 99% 100% 98% 98%   Weight:      Height:       General: alert, cooperative, and no distress Lochia: appropriate Uterine Fundus: firm Incision: N/A DVT Evaluation: No evidence of DVT seen on physical exam. Labs: Lab Results  Component Value Date   WBC 17.8 (H) 05/27/2023   HGB 11.1 (L) 05/27/2023   HCT 31.3 (L) 05/27/2023   MCV 97.2 05/27/2023   PLT 135 (L) 05/27/2023       No data to display         Edinburgh Score:    05/27/2023   11:18 PM  Edinburgh Postnatal Depression Scale Screening Tool  I have been able to laugh and see the funny side of things. 0  I have looked forward with enjoyment to things. 0  I have blamed myself unnecessarily when things went wrong. 1  I have been anxious or worried for no good reason. 0  I have felt scared or panicky for no good reason. 1  Things have been getting on top of me. 1  I have been so unhappy that I have had difficulty sleeping. 0  I have felt sad or miserable. 0  I have been so unhappy that I have been crying. 0  The thought of harming myself has occurred to me. 0  Edinburgh Postnatal Depression Scale Total 3      After visit meds:  Allergies as of 05/28/2023       Reactions   Erythromycin Swelling        Medication List  STOP taking these medications    guaiFENesin 600 MG 12 hr tablet Commonly known as: Mucinex   HYDROcodone bit-homatropine 5-1.5 MG/5ML syrup Commonly known as: HYCODAN   oseltamivir 75 MG capsule Commonly known as: TAMIFLU   predniSONE 20 MG tablet Commonly known as: DELTASONE       TAKE these medications    acetaminophen 500 MG tablet Commonly known as: TYLENOL Take 2 tablets (1,000 mg total) by mouth every 6 (six) hours for 7 days.   ferrous sulfate 325 (65 FE) MG EC tablet Take 325 mg by mouth 3 (three) times daily with meals.   ibuprofen 200 MG tablet Commonly known as: Advil Take 3 tablets (600 mg total) by mouth every 6 (six) hours for 7 days.   multivitamin-prenatal 27-0.8 MG Tabs  tablet Take 1 tablet by mouth daily at 12 noon.   polyethylene glycol 17 g packet Commonly known as: MiraLax Take 17 g by mouth daily.   valACYclovir 500 MG tablet Commonly known as: VALTREX Take 500 mg by mouth 2 (two) times daily.         Discharge home in stable condition Infant Feeding: Breast Infant Disposition:home with mother Discharge instruction: per After Visit Summary and Postpartum booklet. Activity: Advance as tolerated. Pelvic rest for 6 weeks.  Diet: routine diet Anticipated Birth Control: Unsure Postpartum Appointment:6 weeks Additional Postpartum F/U:  N/A Future Appointments:No future appointments. Follow up Visit:  Follow-up Information     Obgyn, [facility]. Schedule an appointment as soon as possible for a visit in 6 week(s).   Contact information: [address] [place] [address] [phone]                     05/28/2023 Edger House, MD

## 2023-05-28 NOTE — Progress Notes (Signed)
 Postpartum Progress Note  PPD#2 s/p SVD  S: Patient seen and examined at bedside. Reports feeling overall well. Pain well controlled, ambulating, tolerating regular diet, voiding without issue. Passing flatus and had a bowel movement. Denies fever, chills, chest pain, shortness of breath.  Feeding: plans to breastfeed Circ: S/p circumcision  O:  Vitals:   05/27/23 2036 05/28/23 0541  BP: 102/62 90/64  Pulse: 71 64  Resp: 18 18  Temp: 98.4 F (36.9 C) 98 F (36.7 C)  SpO2: 98% 98%    PE:  GA: well appearing, NAD CV: RRR, normal S1, S2 Lungs: CTAB Abd: soft, appropriately tender, fundus firm below umbilicus Peri: moderate lochia Ext: no TTP, +1 non-pitting edema  Labs:  Lab Results  Component Value Date   WBC 17.8 (H) 05/27/2023   HGB 11.1 (L) 05/27/2023   HCT 31.3 (L) 05/27/2023   MCV 97.2 05/27/2023   PLT 135 (L) 05/27/2023    A/P:  33 y.o.yo G1P1001 PPD#2 s/p SVD (EBL 110 mL) with gestational thrombocytopenia, doing well and progressing appropriately. Vitals within normal limits. Physical exam benign. Labs normal. Plan as follows:   #Routine OB - Regular diet, HLIV - ERAS for pain control  - Rh positive - DVT ppx: ambulating - BCM: Unsure  #Neonate - S/p circumcision -plans to breastfeed  Plan for discharge home today.     Marlene Bast, MD

## 2023-05-28 NOTE — Social Work (Signed)
 CSW received consult for hx of Anxiety and Depression.  CSW met with MOB to offer support and complete assessment. CSW entered the room, and observed MOB resting in bed, and FOB at bedside holding the infant. CSW introduced self, CSW role and reason fro visit. MOB was agreeable to visit and allowed FOB to remain in the room.  CSW inquired about how MOB was a feeling, MOB reported good. CSW inquired about MON MH x, MOB reported she was diagnosed with anxiety and depression as a child. MOB reported she used to take celexa but stopped after feeling like she no longer needed it. MOB reported she has been seeing a therapist weekly since she was about 4 months pregnant. MOB reported she plans to continue seeing her and has a follow up on 3/5. CSW provided education regarding the baby blues period vs. perinatal mood disorders, discussed treatment and gave resources for mental health follow up if concerns arise.  CSW recommends self-evaluation during the postpartum time period using the New Mom Checklist from Postpartum Progress and encouraged MOB to contact a medical professional if symptoms are noted at any time.  MOB identified FOB, close friends and extended family as her supports.   CSW provided review of Sudden Infant Death Syndrome (SIDS) precautions.  MOB reported she has all necessary items for the infant including a bassinet, crib and car seat.  CSW identifies no further need for intervention and no barriers to discharge at this time.  Wende Neighbors, LCSWA Clinical Social Worker [phone]

## 2023-05-28 NOTE — Discharge Instructions (Signed)
 Congratulations! We hope you have a wonderful postpartum period. We will plan to see you in the office in 6 weeks. Please call the office if you experience fevers (>100.4 F), heavy vaginal bleeding (using >2 pads/hour), severe pain not responsive to ibuprofen (Advil or Motrin) and acetaminophen (Tylenol), chest pain, shortness of breath, or if you have pain, redness, or swelling in one or both legs. Mood swings, fatigue, and feeling down can be normal in the first two weeks following birth. If you feel your mood symptoms are severe or lasting longer than two weeks, please call our office. If you have any thoughts of hurting yourself or others please call our office. Please call your pediatrician if you have any concerns about your baby.

## 2023-06-06 ENCOUNTER — Telehealth (HOSPITAL_COMMUNITY): Payer: Self-pay

## 2023-06-06 NOTE — Telephone Encounter (Signed)
 06/06/2023 1000  Name: Tonjia Parillo MRN: [id number] DOB: 11-24-1990  Reason for Call:  Transition of Care Hospital Discharge Call  Contact Status: Patient Contact Status: Message  Language assistant needed:          Follow-Up Questions:    Inocente Salles Postnatal Depression Scale:  In the Past 7 Days:    PHQ2-9 Depression Scale:     Discharge Follow-up:    Post-discharge interventions: NA  Signature  Signe Colt
